# Patient Record
Sex: Male | Born: 1965 | Race: White | Hispanic: No | Smoking: Never smoker
Health system: Southern US, Community
[De-identification: ages and names within clinical notes are randomized; demographics above are authoritative.]

## PROBLEM LIST (undated history)

## (undated) DIAGNOSIS — J189 Pneumonia, unspecified organism: Secondary | ICD-10-CM

## (undated) HISTORY — PX: NECK SURGERY: SHX720

---

## 2018-02-09 ENCOUNTER — Other Ambulatory Visit: Payer: Self-pay

## 2018-02-09 ENCOUNTER — Emergency Department (HOSPITAL_COMMUNITY): Payer: Worker's Compensation

## 2018-02-09 ENCOUNTER — Emergency Department (HOSPITAL_COMMUNITY)
Admission: EM | Admit: 2018-02-09 | Discharge: 2018-02-09 | Disposition: A | Discharge status: Home / Self Care | Payer: Worker's Compensation | Attending: Emergency Medicine | Admitting: Emergency Medicine

## 2018-02-09 ENCOUNTER — Encounter (HOSPITAL_COMMUNITY): Payer: Self-pay | Admitting: Emergency Medicine

## 2018-02-09 DIAGNOSIS — Y929 Unspecified place or not applicable: Secondary | ICD-10-CM | POA: Insufficient documentation

## 2018-02-09 DIAGNOSIS — S0990XA Unspecified injury of head, initial encounter: Secondary | ICD-10-CM | POA: Insufficient documentation

## 2018-02-09 DIAGNOSIS — R079 Chest pain, unspecified: Secondary | ICD-10-CM | POA: Diagnosis not present

## 2018-02-09 DIAGNOSIS — R109 Unspecified abdominal pain: Secondary | ICD-10-CM | POA: Insufficient documentation

## 2018-02-09 DIAGNOSIS — M542 Cervicalgia: Secondary | ICD-10-CM | POA: Insufficient documentation

## 2018-02-09 DIAGNOSIS — M25561 Pain in right knee: Secondary | ICD-10-CM | POA: Insufficient documentation

## 2018-02-09 DIAGNOSIS — T148XXA Other injury of unspecified body region, initial encounter: Secondary | ICD-10-CM | POA: Insufficient documentation

## 2018-02-09 DIAGNOSIS — T07XXXA Unspecified multiple injuries, initial encounter: Secondary | ICD-10-CM

## 2018-02-09 DIAGNOSIS — Y999 Unspecified external cause status: Secondary | ICD-10-CM | POA: Diagnosis not present

## 2018-02-09 DIAGNOSIS — Y9389 Activity, other specified: Secondary | ICD-10-CM | POA: Diagnosis not present

## 2018-02-09 DIAGNOSIS — R51 Headache: Secondary | ICD-10-CM | POA: Insufficient documentation

## 2018-02-09 DIAGNOSIS — M25562 Pain in left knee: Secondary | ICD-10-CM | POA: Insufficient documentation

## 2018-02-09 DIAGNOSIS — S59911A Unspecified injury of right forearm, initial encounter: Secondary | ICD-10-CM | POA: Diagnosis present

## 2018-02-09 HISTORY — DX: Pneumonia, unspecified organism: J18.9

## 2018-02-09 LAB — CBC WITH DIFFERENTIAL/PLATELET
Abs Immature Granulocytes: 0.1 10*3/uL (ref 0.0–0.1)
BASOS ABS: 0.1 10*3/uL (ref 0.0–0.1)
BASOS PCT: 1 %
Eosinophils Absolute: 0.6 10*3/uL (ref 0.0–0.7)
Eosinophils Relative: 6 %
HCT: 40.5 % (ref 39.0–52.0)
HEMOGLOBIN: 14.6 g/dL (ref 13.0–17.0)
IMMATURE GRANULOCYTES: 1 %
LYMPHS PCT: 17 %
Lymphs Abs: 1.8 10*3/uL (ref 0.7–4.0)
MCH: 29.7 pg (ref 26.0–34.0)
MCHC: 36 g/dL (ref 30.0–36.0)
MCV: 82.3 fL (ref 78.0–100.0)
MONOS PCT: 7 %
Monocytes Absolute: 0.7 10*3/uL (ref 0.1–1.0)
NEUTROS ABS: 7.5 10*3/uL (ref 1.7–7.7)
NEUTROS PCT: 70 %
PLATELETS: 221 10*3/uL (ref 150–400)
RBC: 4.92 MIL/uL (ref 4.22–5.81)
RDW: 11.6 % (ref 11.5–15.5)
WBC: 10.6 10*3/uL — ABNORMAL HIGH (ref 4.0–10.5)

## 2018-02-09 LAB — TYPE AND SCREEN
ABO/RH(D): A POS
Antibody Screen: NEGATIVE

## 2018-02-09 LAB — COMPREHENSIVE METABOLIC PANEL
ALK PHOS: 50 U/L (ref 38–126)
ALT: 47 U/L (ref 17–63)
AST: 37 U/L (ref 15–41)
Albumin: 3.9 g/dL (ref 3.5–5.0)
Anion gap: 11 (ref 5–15)
BUN: 15 mg/dL (ref 6–20)
CHLORIDE: 99 mmol/L — AB (ref 101–111)
CO2: 24 mmol/L (ref 22–32)
Calcium: 9.4 mg/dL (ref 8.9–10.3)
Creatinine, Ser: 1.06 mg/dL (ref 0.61–1.24)
GFR calc Af Amer: >60 mL/min (ref >60)
GFR calc non Af Amer: >60 mL/min (ref >60)
GLUCOSE: 282 mg/dL — AB (ref 65–99)
Potassium: 4.2 mmol/L (ref 3.5–5.1)
SODIUM: 134 mmol/L — AB (ref 135–145)
Total Bilirubin: 0.7 mg/dL (ref 0.3–1.2)
Total Protein: 6.9 g/dL (ref 6.5–8.1)

## 2018-02-09 LAB — ETHANOL

## 2018-02-09 LAB — I-STAT CHEM 8, ED
BUN: 18 mg/dL (ref 6–20)
CALCIUM ION: 1.22 mmol/L (ref 1.15–1.40)
CREATININE: 1 mg/dL (ref 0.61–1.24)
Chloride: 99 mmol/L — ABNORMAL LOW (ref 101–111)
GLUCOSE: 288 mg/dL — AB (ref 65–99)
HCT: 42 % (ref 39.0–52.0)
Hemoglobin: 14.3 g/dL (ref 13.0–17.0)
Potassium: 4.1 mmol/L (ref 3.5–5.1)
Sodium: 134 mmol/L — ABNORMAL LOW (ref 135–145)
TCO2: 24 mmol/L (ref 22–32)

## 2018-02-09 LAB — ABO/RH: ABO/RH(D): A POS

## 2018-02-09 LAB — PROTIME-INR
INR: 0.94
Prothrombin Time: 12.5 seconds (ref 11.4–15.2)

## 2018-02-09 MED ORDER — FENTANYL CITRATE (PF) 100 MCG/2ML IJ SOLN
100.0000 ug | Freq: Once | INTRAMUSCULAR | Status: AC
  Administered 2018-02-09: 100 ug via INTRAVENOUS
  Filled 2018-02-09: qty 2

## 2018-02-09 MED ORDER — BACITRACIN ZINC 500 UNIT/GM EX OINT: 1.0000 "application " | TOPICAL_OINTMENT | Freq: Two times a day (BID) | CUTANEOUS | Status: DC

## 2018-02-09 MED ORDER — HYDROCODONE-ACETAMINOPHEN 5-325 MG PO TABS: 1.0000 | ORAL_TABLET | Freq: Four times a day (QID) | ORAL | 0 refills | Status: AC | PRN

## 2018-02-09 MED ORDER — HYDROCODONE-ACETAMINOPHEN 5-325 MG PO TABS
2.0000 | ORAL_TABLET | Freq: Once | ORAL | Status: AC
  Administered 2018-02-09: 2 via ORAL
  Filled 2018-02-09: qty 2

## 2018-02-09 MED ORDER — IOHEXOL 300 MG/ML  SOLN
100.0000 mL | Freq: Once | INTRAMUSCULAR | Status: AC | PRN
  Administered 2018-02-09: 100 mL via INTRAVENOUS

## 2018-02-09 MED ORDER — IBUPROFEN 600 MG PO TABS: 600.0000 mg | ORAL_TABLET | Freq: Four times a day (QID) | ORAL | 0 refills | Status: AC | PRN

## 2018-02-09 NOTE — ED Notes (Signed)
Pt requesting more pain meds.  Dr. Hyacinth MeekerMiller made aware
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
